# Patient Record
Sex: Male | Born: 1950 | Race: White | Marital: Married | Smoking: Never smoker
Health system: Northeastern US, Academic
[De-identification: ages and names within clinical notes are randomized; demographics above are authoritative.]

---

## 2015-02-05 ENCOUNTER — Other Ambulatory Visit: Payer: Self-pay | Admitting: Cardiology

## 2015-02-05 DIAGNOSIS — I2510 Atherosclerotic heart disease of native coronary artery without angina pectoris: Secondary | ICD-10-CM

## 2015-05-13 DIAGNOSIS — I10 Essential (primary) hypertension: Secondary | ICD-10-CM | POA: Insufficient documentation

## 2015-05-20 ENCOUNTER — Encounter: Payer: Self-pay | Admitting: Cardiology

## 2015-05-20 ENCOUNTER — Ambulatory Visit: Admit: 2015-05-20 | Payer: Self-pay | Source: Ambulatory Visit | Admitting: Cardiology

## 2015-05-20 ENCOUNTER — Ambulatory Visit: Admit: 2015-05-20 | Discharge: 2015-05-20 | Disposition: A | Discharge status: Home / Self Care | Payer: Self-pay

## 2015-05-20 ENCOUNTER — Ambulatory Visit: Payer: Self-pay | Admitting: Cardiology

## 2015-05-20 VITALS — BP 140/78 | HR 60 | Ht 72.0 in | Wt 190.0 lb

## 2015-05-20 DIAGNOSIS — I2510 Atherosclerotic heart disease of native coronary artery without angina pectoris: Secondary | ICD-10-CM

## 2015-05-20 DIAGNOSIS — I429 Cardiomyopathy, unspecified: Secondary | ICD-10-CM

## 2015-05-20 LAB — ECHO COMPLETE
AV CWD Velocity (Peak): 150 cm/s
Aortic Arch Diameter: 3.39 cm
Aortic Diameter (mid tubular): 3.25 cm
Aortic Diameter (sinus of Valsalva): 3.24 cm
E/A ratio: 1.17
LA Systolic Diameter: 4.95 cm
LV ASE Mass: 242.5 gm
LV Posterior Wall Thickness: 0.98 cm
LV Septal Thickness: 1.05 cm
LV Stroke Volume: 62 mL
LVED Diameter: 5.87 cm
LVED Volume: 115 mL
LVEF (Volume): 54 %
LVES Diameter: 4.17 cm
LVES Volume: 53 mL
LVOT + AV Gradient (peak): 9 mmHg
MV Peak A Velocity: 62.1 cm/s
MV Peak E Velocity: 72.4 cm/s

## 2015-05-20 MED ORDER — PERFLUTREN LIPID MICROSPHERE 1.1 MG/ML (DEFINITY) IV SUSP *I*
1.3000 mL | INTRAVENOUS | Status: AC | PRN
Start: 2015-05-20 — End: 2015-05-21

## 2015-05-20 NOTE — Progress Notes (Signed)
Cardiology Office Revisit Note    Date of Visit: 05/20/2015 Patient: David Cummings   Patients PCP: Merlyn Lot Patient DOB: Jul 29, 1951     Subjective/Reason For Visit     I had the pleasure of seeing David Cummings in cardiology followup on 05/20/2015.     As you recall, he is a pleasant 64 year old gentleman with a mild form of alcoholic cardiomyopathy, long-standing mild hypertension, and hyperlipidemia.   David Cummings returned today with a followup with an echocardiogram.  He admits that he has been noncompliant with his exercise program and intake of alcohol.    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   He has been monitoring his blood pressure and the readings have been between 130-135      ROS   Constitutional symptoms negative  Weight gain a few pounds  GI unremarkable  Respiratory unremarkable  Cardiac as described above  Neurological unremarkable      Medications     Current Outpatient Prescriptions   Medication Sig    atorvastatin (LIPITOR) 20 MG tablet Take by mouth daily (with dinner)    lisinopril (PRINIVIL,ZESTRIL) 10 MG tablet Take 10 mg by mouth daily    metoprolol (TOPROL-XL) 25 MG 24 hr tablet Take 12.5 mg by mouth daily   Do not crush or chew. May be divided.    allopurinol (ZYLOPRIM) 300 MG tablet Take 150 mg by mouth daily    aspirin (ASPIRIN EC) 81 MG EC tablet Take 81 mg by mouth daily    Omega-3 Fatty Acids (FISH OIL BURP-LESS PO) Take by mouth    Multiple Vitamin (MULTI VITAMIN DAILY PO) Take by mouth     Vitals and Physical Exam     [name]'s  height is 1.829 m (6') and weight is 86.2 kg (190 lb). His blood pressure is 140/78 and his pulse is 60.  Body mass index is 25.77 kg/(m^2).    Physical Exam   Well-built healthy-appearing  male  Vital signs reviewed and stable as above  The patient appeared comfortable in no acute distress.  HEENT with normal carotid upstroke and normal jugular venous pressure.  There are no carotid bruits or transmitted murmurs.  There is no conjunctival pallor or icterus.  There is no thyromegaly  Cardiac examination with regular sounds and nondisplaced PMI.  There are no murmurs or gallops audible.   Examination of the lungs revealed adequate air entry bilaterally with no wheezing or rales.  Abdominal examination limited.  Vascular examination revealed good dorsalis pedis and posterior tibial bilaterally 3+.  Both radial pulses are good 3+  Extremities revealed no cyanosis or clubbing.  Skin examination showed no areas of ecchymosis or bruises or rash.  Neurological examination is grossly intact with no focal deficits.  Gait is normal    Laboratory Data   BUN 20 creatinine 1.0 potassium 4.4    Total cholesterol 185 triglycerides 93 HDL 46 LDL 90  Cardiac/Imaging Data     ECG:       Echo Complete 05/20/2015    Narrative  The left ventricular ejection fraction is low normal (50-54%). The   calculated LVEF is 54%. Calculated EF: 55 %.   The size of the left atrial cavity is moderately enlarged   No significant  valvular abnormalities noted.                      Impression and Plan     Patient Active Problem List   Diagnosis Code    HTN (hypertension) I10       This is an 64 y.o. 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  He also has hypertension and hyperlipidemia.    Cardiomyopathy with low normal LV ejection fraction.  Reviewed the need for abstaining from alcohol use and maintaining good control of blood pressure to prevent recurrent cardiomyopathy  I told him to reduce if not stopping alcohol consumption.  Blood pressure control needs improvement.  Asked him to continue to monitor the readings and call is back for systolic above  120.  As per new data from [employer] study of target systolic should be on 120 for better long-term outcomes.    Hyperlipidemia with good lipid profile results    Weight gain reviewed and I have asked him to be prudent with intake of carbs to lower his body weight.  Also asked him to resume exercise program including tennis.    I will see him in follow-up in a year with a stress echocardiogram.    Thank you for allowing me to participate in the care of this pleasant patient.           Jess Barters Clarabel Marion, MD  Electronically signed on 05/20/2015 at 10:23 AM.

## 2015-05-21 ENCOUNTER — Telehealth: Payer: Self-pay | Admitting: Cardiology

## 2015-05-21 NOTE — Telephone Encounter (Signed)
05/21/15-left patient message to call me back

## 2016-06-15 ENCOUNTER — Telehealth: Payer: Self-pay | Admitting: Cardiology

## 2016-06-15 NOTE — Telephone Encounter (Signed)
Patient called to cancel [address]m SE and RV. He has moved to [facility] and will no longer need any appointments here.

## 2016-11-03 ENCOUNTER — Ambulatory Visit: Payer: Self-pay | Admitting: Cardiology

## 2016-11-03 ENCOUNTER — Other Ambulatory Visit: Payer: Self-pay

## 2024-01-31 IMAGING — MR MRI RIGHT FOOT WITHOUT CONTRAST
4 of 7 series · 17 of 40 positions shown · IV contrast (gadolinium)
Comparison: None.

________________________________________________________________________________________________ 
MRI RIGHT FOOT WITHOUT CONTRAST, 01/31/2024 [DATE]: 
CLINICAL INDICATION: Patient injured right foot and Diop. Lateral pain. 
Evaluate for tear and peroneal tendons. Evaluate for sinus tarsi syndrome.
TECHNIQUE: Multiplanar, multiecho position MR images of the foot were performed 
without intravenous gadolinium enhancement. Patient was scanned on a 3T magnet

[Series 201: survey_mst · axial · 10.0mm · 0.78mm/px · z∈[-139,+189]mm · 3 of 15 slices shown (1 of 2)]
[im 1/15]
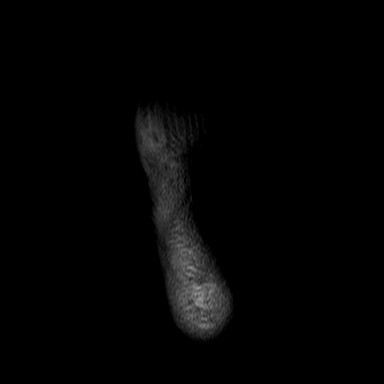
[im 8/15]
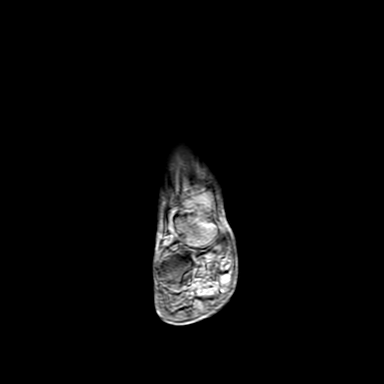
[im 15/15]
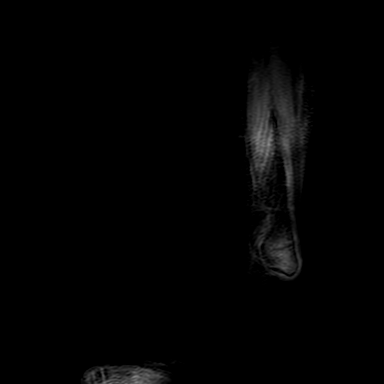

[Series 301: survey_mst · axial · 10.0mm · 0.78mm/px · z∈[-129,+152]mm · 2 of 14 slices shown (2 of 2)]
[im 1/14]
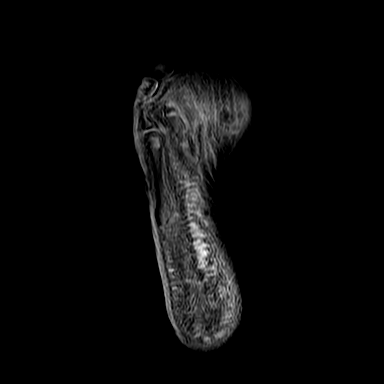
[im 14/14]
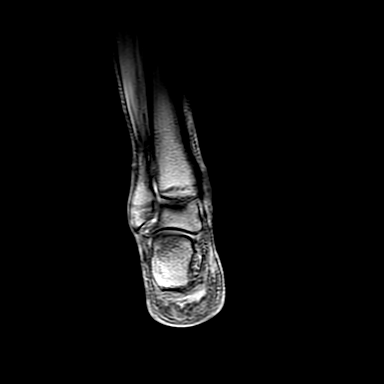

[Series 501: PD fat-sat · sagittal · 2.5mm · 0.30mm/px · 6 of 35 slices shown]
[im 1/35]
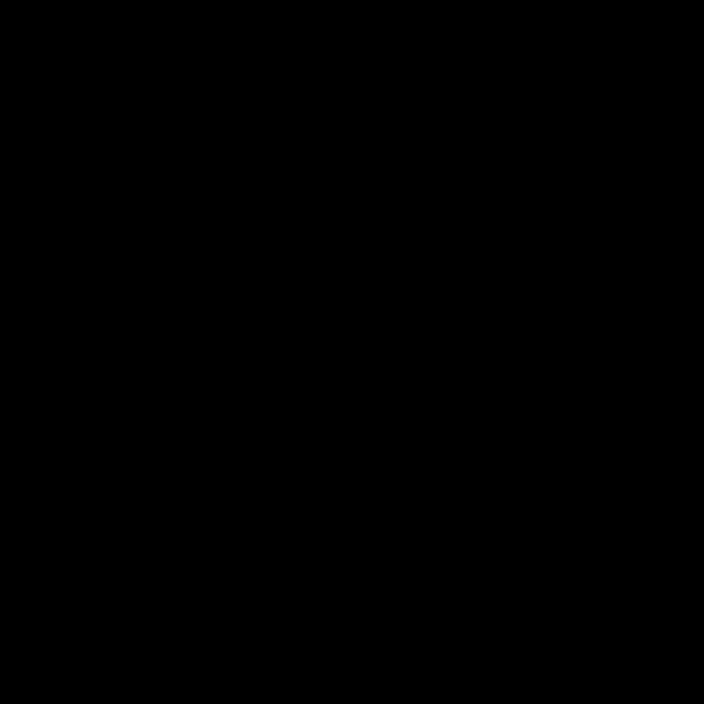
[im 7/35]
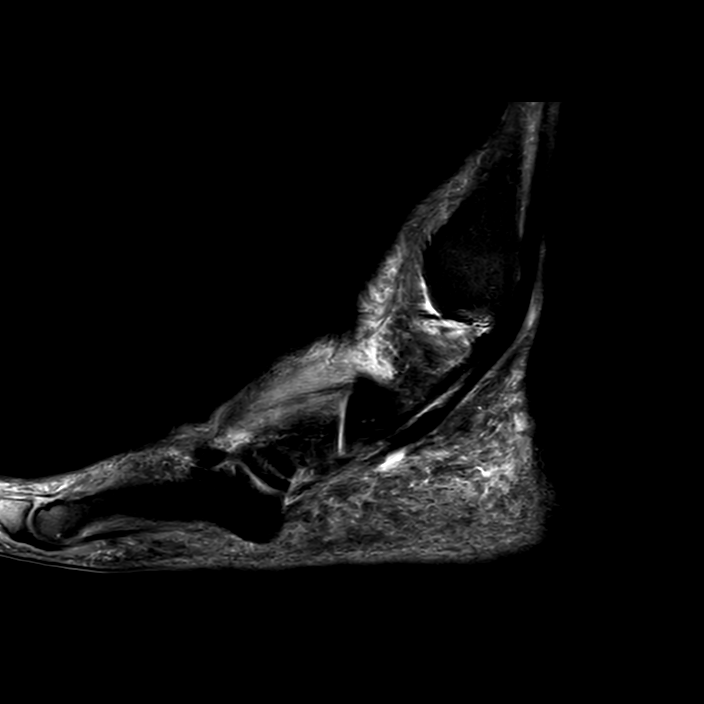
[im 14/35]
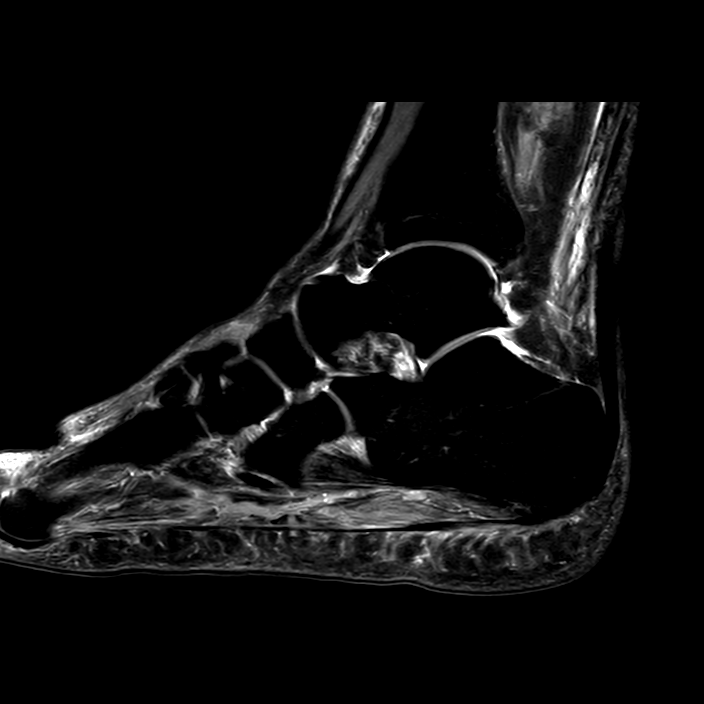
[im 21/35]
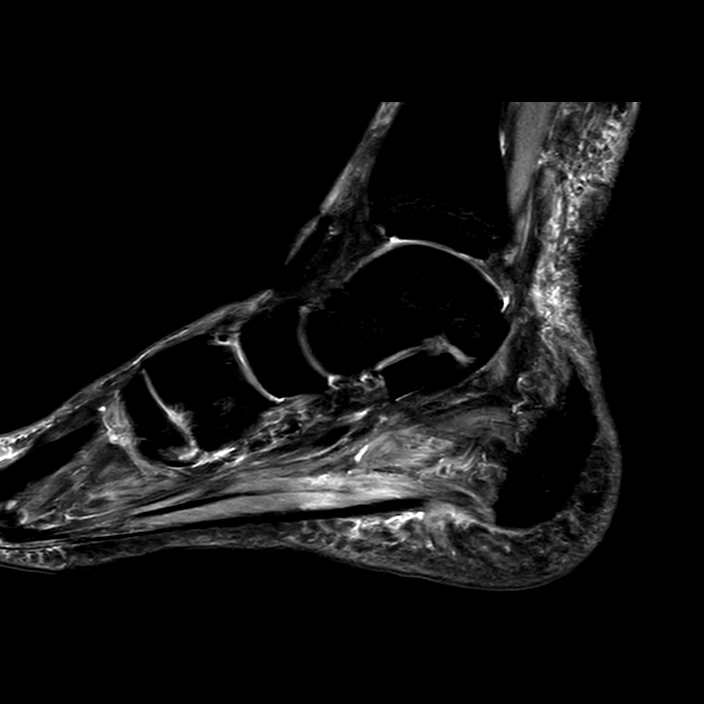
[im 28/35]
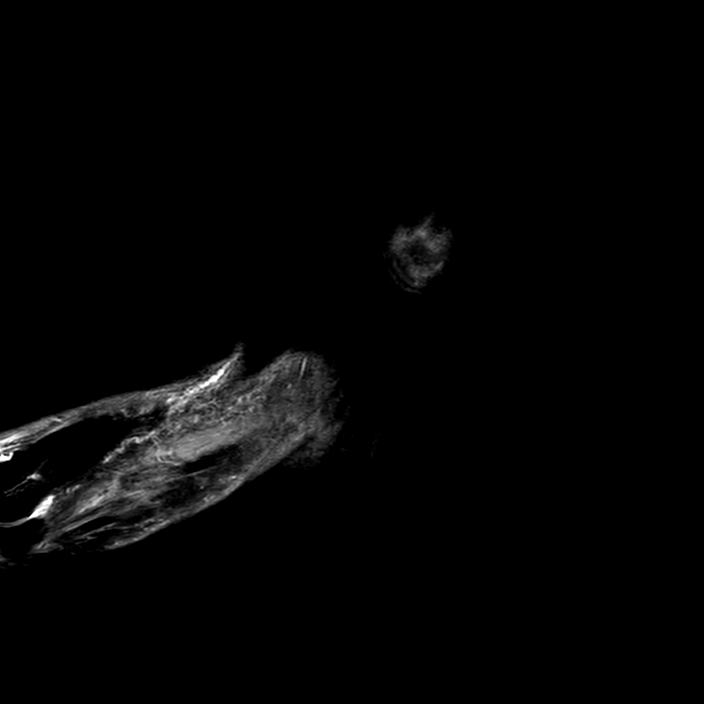
[im 35/35]
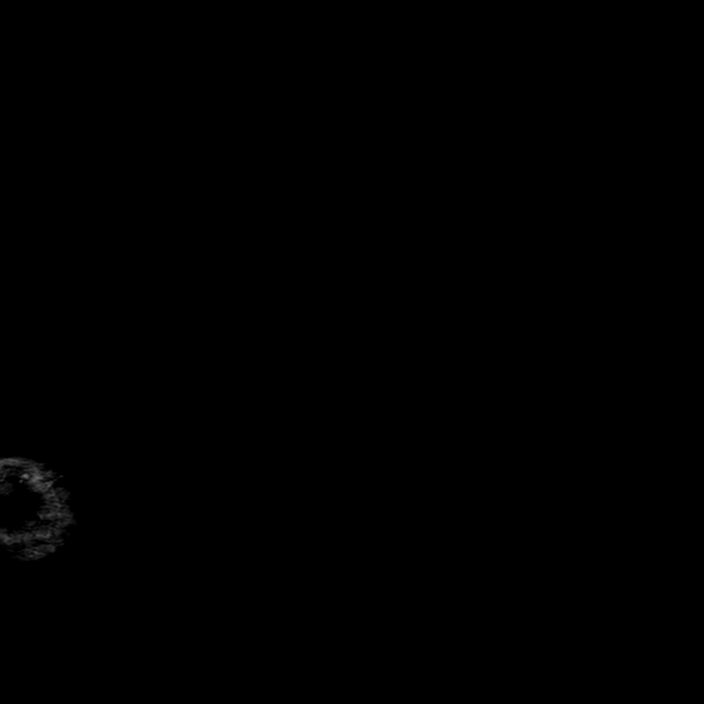

[Series 701: T1 · coronal · 3.0mm · 0.27mm/px · 6 of 50 slices shown]
[im 1/50]
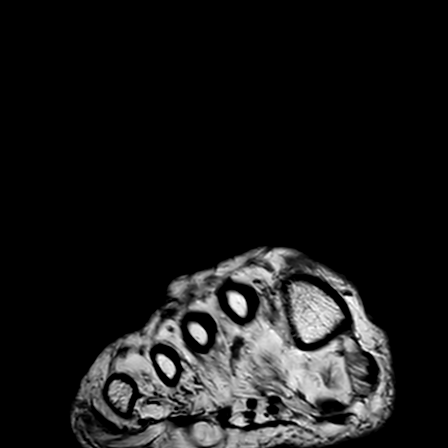
[im 8/50]
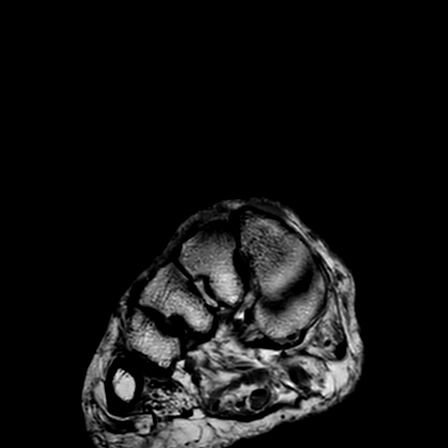
[im 15/50]
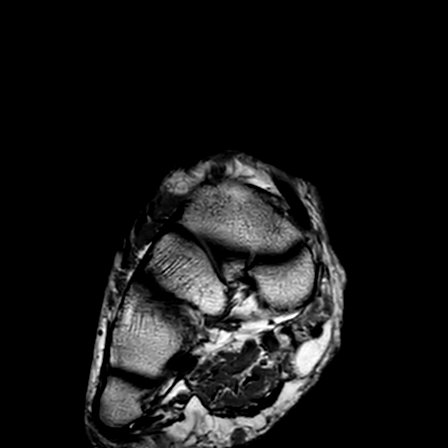
[im 22/50]
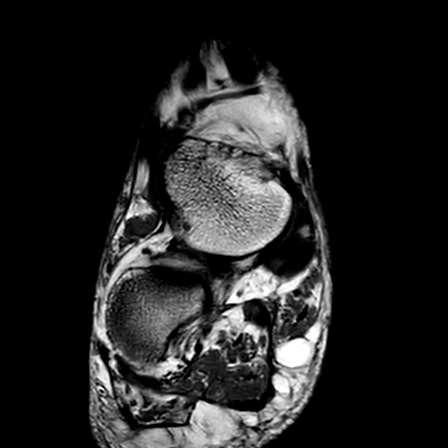
[im 29/50]
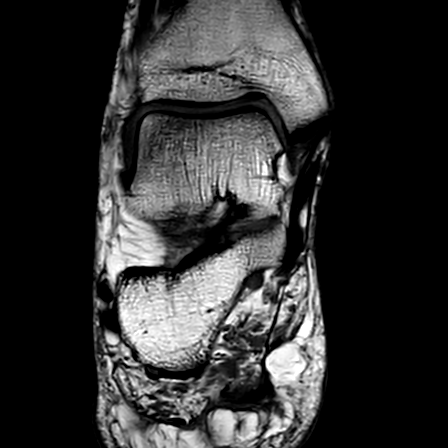
[im 43/50]
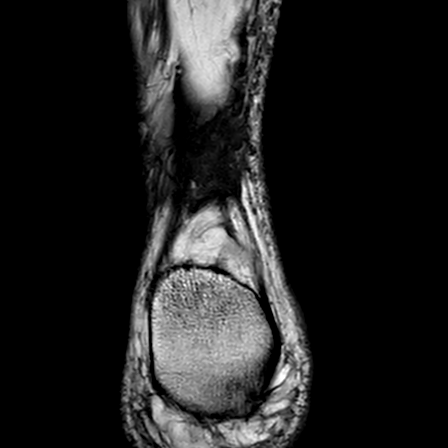

[17 of 40 positions shown; findings below may reference images not displayed]

FINDINGS: TENDONS: There is diffuse thickening of the Achilles tendon measuring up to
cm TR by 1.2 cm AP. The flexor, extensor and peroneal tendons are intact without 
tear, tendinosis or tendon subluxation. Specifically, the peroneal tendons are 
preserved.. 
LIGAMENTS:  
LATERAL LIGAMENTS: The anterior talofibular ligament is intact. The 
calcaneofibular ligament and posterior talofibular ligament are preserved. 
SYNDESMOTIC LIGAMENTS: The anterior and posterior tibiofibular and interosseous 
ligaments are preserved. 
DELTOID LIGAMENTOUS COMPLEX: The deep and superficial components of the deltoid 
ligamentous complex are intact. 
SINUS TARSI LIGAMENTS: The cervical and interosseous ligaments are preserved. 
The inferior extensor retinaculum appears intact.  
BONES AND JOINTS: There are scattered areas articular cartilaginous loss with 
subcortical cystic change involving the lateral cuneiform. No fracture. Normal 
alignment. Talar dome is preserved. No focal osteochondral lesion. 
MUSCLES: There is symmetric fatty atrophy/fatty infiltration of the musculature 
of the foot and ankle. 
OTHER SOFT TISSUES: There is thickening of the central band of the plantar 
fascia with high-grade partial tearing of the majority of the central band 
attachment. The medial and lateral bands remaining intact. There is mild 
surrounding soft tissue edema. Sinus tarsi fat is preserved. Tarsal tunnel is 
negative.
IMPRESSION: 1.  Plantar fasciitis with high grade partial tearing of the central band at the 
calcaneal attachment. 
2.  Achilles tendinosis. 
3.  Mild degenerative changes.

## 4703-05-31 DEATH — deceased
